# Patient Record
Sex: Female | Born: 1965 | Race: White | Hispanic: No | Marital: Married | Smoking: Current every day smoker
Health system: Southern US, Community
[De-identification: ages and names within clinical notes are randomized; demographics above are authoritative.]

## PROBLEM LIST (undated history)

## (undated) DIAGNOSIS — K219 Gastro-esophageal reflux disease without esophagitis: Secondary | ICD-10-CM

## (undated) DIAGNOSIS — E039 Hypothyroidism, unspecified: Secondary | ICD-10-CM

## (undated) HISTORY — PX: OTHER SURGICAL HISTORY: SHX169

## (undated) HISTORY — PX: TONSILLECTOMY: SUR1361

## (undated) HISTORY — PX: SPLENECTOMY: SUR1306

## (undated) HISTORY — PX: ABDOMINAL HYSTERECTOMY: SHX81

## (undated) HISTORY — DX: Gastro-esophageal reflux disease without esophagitis: K21.9

## (undated) HISTORY — DX: Hypothyroidism, unspecified: E03.9

---

## 2015-07-02 ENCOUNTER — Ambulatory Visit (INDEPENDENT_AMBULATORY_CARE_PROVIDER_SITE_OTHER): Payer: BLUE CROSS/BLUE SHIELD | Admitting: "Endocrinology

## 2015-07-02 ENCOUNTER — Encounter: Payer: Self-pay | Admitting: "Endocrinology

## 2015-07-02 VITALS — BP 106/65 | HR 68 | Ht 64.0 in | Wt 165.0 lb

## 2015-07-02 DIAGNOSIS — R7302 Impaired glucose tolerance (oral): Secondary | ICD-10-CM

## 2015-07-02 DIAGNOSIS — R7309 Other abnormal glucose: Secondary | ICD-10-CM

## 2015-07-02 DIAGNOSIS — E039 Hypothyroidism, unspecified: Secondary | ICD-10-CM | POA: Diagnosis not present

## 2015-07-02 DIAGNOSIS — E559 Vitamin D deficiency, unspecified: Secondary | ICD-10-CM | POA: Diagnosis not present

## 2015-07-02 DIAGNOSIS — R7303 Prediabetes: Secondary | ICD-10-CM | POA: Insufficient documentation

## 2015-07-02 NOTE — Progress Notes (Signed)
Subjective:    Patient ID: Lorraine Simon, female    DOB: 01/03/1966, PCP Lorraine Simon   Past Medical History  Diagnosis Date  . Hypothyroidism   . GERD (gastroesophageal reflux disease)    Past Surgical History  Procedure Laterality Date  . Tonsillectomy    . Abdominal hysterectomy    . Other surgical history      Spleenectomy   Social History   Social History  . Marital Status: Married    Spouse Name: N/A  . Number of Children: N/A  . Years of Education: N/A   Social History Main Topics  . Smoking status: Current Every Day Smoker  . Smokeless tobacco: Not on file  . Alcohol Use: No  . Drug Use: No  . Sexual Activity: Not on file   Other Topics Concern  . Not on file   Social History Narrative  . No narrative on file   Outpatient Encounter Prescriptions as of 07/02/2015  Medication Sig  . fluticasone (FLONASE) 50 MCG/ACT nasal spray Place into both nostrils daily.  Marland Kitchen. gabapentin (NEURONTIN) 300 MG capsule Take 300 mg by mouth 2 (two) times daily.  Marland Kitchen. levothyroxine (SYNTHROID, LEVOTHROID) 75 MCG tablet Take 75 mcg by mouth daily before breakfast.  . loratadine (CLARITIN) 10 MG tablet Take 10 mg by mouth daily.  . metFORMIN (GLUCOPHAGE) 500 MG tablet Take by mouth daily with breakfast.  . sertraline (ZOLOFT) 50 MG tablet Take 50 mg by mouth daily.  . Vitamin D, Ergocalciferol, (DRISDOL) 50000 UNITS CAPS capsule Take 50,000 Units by mouth every 7 (seven) days.   No facility-administered encounter medications on file as of 07/02/2015.   ALLERGIES: Allergies  Allergen Reactions  . Codeine   . Hydrocodone   . Oxycodone    VACCINATION STATUS:  There is no immunization history on file for this patient.  HPI  Lorraine Simon is a 49 yr old female with medical hx of Hashimoto's thyroiditis with hypothyroidism . she is here to f/u with repeat labs. her labs also show prediabetes ans vit D deficiency. she tolerated MTF, a1c stays at 5.8%.  she is tolerating  Lt4 75 mcg po qam . she has no new complaints. she lost a total of 37 lbs.  she feels well. denies any hx of goiter. she has family hx of thyroid dysfunction in her mother, but denies any hx of thyroid cancer. she is a chronic heavy smoker.  Review of Systems  Constitutional: no weight gain/loss, no fatigue, no subjective hyperthermia/hypothermia Eyes: no blurry vision, no xerophthalmia ENT: no sore throat, no nodules palpated in throat, no dysphagia/odynophagia, no hoarseness Cardiovascular: no CP/SOB/palpitations/leg swelling Respiratory: no cough/SOB Gastrointestinal: no N/V/D/C Musculoskeletal: no muscle/joint aches Skin: no rashes Neurological: no tremors/numbness/tingling/dizziness Psychiatric: no depression/anxiety   Objective:    BP 106/65 mmHg  Pulse 68  Ht 5\' 4"  (1.626 m)  Wt 165 lb (74.844 kg)  BMI 28.31 kg/m2  SpO2 95%  Wt Readings from Last 3 Encounters:  07/02/15 165 lb (74.844 kg)    Physical Exam   Constitutional: overweight, in NAD Eyes: PERRLA, EOMI, no exophthalmos ENT: moist mucous membranes, no thyromegaly, no cervical lymphadenopathy Cardiovascular: RRR, No MRG Respiratory: CTA B Gastrointestinal: abdomen soft, NT, ND, BS+ Musculoskeletal: no deformities, strength intact in all 4 Skin: moist, warm, no rashes Neurological: no tremor with outstretched hands, DTR normal in all 4   Assessment & Plan:   1. Primary hypothyroidism she has Hashimotos's thyroidistis with Hypothyroidism. She has done better with  LT4 at 75 mcg po qam.  TFTs are c/w appropriate replacement. she will continue on same.   2. Impaired glucose tolerance test  I have discussed prediabetes in detail with her, dietary, exercise recommendation are provided. I have suggested and she accepted MTF  po qday to delay progress to type 2 DM, her last a1c remains stable at 5.8%.   3. Vitamin D deficiency She  is s/p therapy with Vitamin D x 12 weeks.  she is extensively  counseled on smoking cessation. she will RTN in 6 months  with repeat labs, for reevaluation.   - I advised patient to maintain close follow up with [facility], [facility] for primary care needs.  Follow up plan: Return in about 6 months (around 12/30/2015) for underactive thyroid, prediabetes.  [name], MD Phone: [phone]  Fax: [phone]   07/02/2015, 9:33 PM

## 2015-07-18 ENCOUNTER — Other Ambulatory Visit: Payer: Self-pay

## 2015-07-18 MED ORDER — METFORMIN HCL 500 MG PO TABS: 500.0000 mg | ORAL_TABLET | Freq: Every day | ORAL | Status: DC

## 2015-07-18 MED ORDER — LEVOTHYROXINE SODIUM 75 MCG PO TABS: 75.0000 ug | ORAL_TABLET | Freq: Every day | ORAL | Status: DC

## 2015-12-18 ENCOUNTER — Other Ambulatory Visit: Payer: Self-pay | Admitting: "Endocrinology

## 2015-12-18 DIAGNOSIS — R7309 Other abnormal glucose: Secondary | ICD-10-CM

## 2015-12-18 DIAGNOSIS — E039 Hypothyroidism, unspecified: Secondary | ICD-10-CM

## 2015-12-18 LAB — BASIC METABOLIC PANEL
BUN: 9 mg/dL (ref 7–25)
CALCIUM: 9.4 mg/dL (ref 8.6–10.2)
CO2: 26 mmol/L (ref 20–31)
Chloride: 102 mmol/L (ref 98–110)
Creat: 0.81 mg/dL (ref 0.50–1.10)
GLUCOSE: 89 mg/dL (ref 65–99)
Potassium: 4.2 mmol/L (ref 3.5–5.3)
SODIUM: 137 mmol/L (ref 135–146)

## 2015-12-18 LAB — T4, FREE: Free T4: 1.4 ng/dL (ref 0.8–1.8)

## 2015-12-18 LAB — TSH: TSH: 1.59 m[IU]/L

## 2015-12-19 LAB — HEMOGLOBIN A1C
Hgb A1c MFr Bld: 5.5 % (ref <5.7)
Mean Plasma Glucose: 111 mg/dL

## 2016-01-01 ENCOUNTER — Ambulatory Visit (INDEPENDENT_AMBULATORY_CARE_PROVIDER_SITE_OTHER): Payer: BLUE CROSS/BLUE SHIELD | Admitting: "Endocrinology

## 2016-01-01 ENCOUNTER — Encounter: Payer: Self-pay | Admitting: "Endocrinology

## 2016-01-01 VITALS — BP 110/70 | HR 70 | Ht 64.0 in | Wt 170.0 lb

## 2016-01-01 DIAGNOSIS — E559 Vitamin D deficiency, unspecified: Secondary | ICD-10-CM

## 2016-01-01 DIAGNOSIS — E039 Hypothyroidism, unspecified: Secondary | ICD-10-CM

## 2016-01-01 DIAGNOSIS — R7302 Impaired glucose tolerance (oral): Secondary | ICD-10-CM | POA: Diagnosis not present

## 2016-01-01 DIAGNOSIS — R7309 Other abnormal glucose: Secondary | ICD-10-CM

## 2016-01-01 MED ORDER — LEVOTHYROXINE SODIUM 75 MCG PO TABS: 75.0000 ug | ORAL_TABLET | Freq: Every day | ORAL | Status: DC

## 2016-01-01 MED ORDER — METFORMIN HCL 500 MG PO TABS: 500.0000 mg | ORAL_TABLET | Freq: Every day | ORAL | Status: DC

## 2016-01-01 MED ORDER — VITAMIN D (ERGOCALCIFEROL) 1.25 MG (50000 UNIT) PO CAPS: 50000.0000 [IU] | ORAL_CAPSULE | ORAL | Status: DC

## 2016-01-01 NOTE — Progress Notes (Signed)
Subjective:    Patient ID: Lorraine Simon, female    DOB: 01-Jan-1966, PCP Lorelei Pont, DO   Past Medical History  Diagnosis Date  . Hypothyroidism   . GERD (gastroesophageal reflux disease)    Past Surgical History  Procedure Laterality Date  . Tonsillectomy    . Abdominal hysterectomy    . Other surgical history      Spleenectomy   Social History   Social History  . Marital Status: Married    Spouse Name: N/A  . Number of Children: N/A  . Years of Education: N/A   Social History Main Topics  . Smoking status: Current Every Day Smoker  . Smokeless tobacco: None  . Alcohol Use: No  . Drug Use: No  . Sexual Activity: Not Asked   Other Topics Concern  . None   Social History Narrative   Outpatient Encounter Prescriptions as of 01/01/2016  Medication Sig  . linaclotide (LINZESS) 145 MCG CAPS capsule Take 145 mcg by mouth daily before breakfast.  . omeprazole (PRILOSEC) 20 MG capsule Take 20 mg by mouth daily.  Marland Kitchen zolpidem (AMBIEN) 10 MG tablet Take 10 mg by mouth at bedtime as needed for sleep.  . fluticasone (FLONASE) 50 MCG/ACT nasal spray Place into both nostrils daily.  Marland Kitchen levothyroxine (SYNTHROID, LEVOTHROID) 75 MCG tablet Take 1 tablet (75 mcg total) by mouth daily before breakfast.  . loratadine (CLARITIN) 10 MG tablet Take 10 mg by mouth daily.  . metFORMIN (GLUCOPHAGE) 500 MG tablet Take 1 tablet (500 mg total) by mouth daily with breakfast.  . sertraline (ZOLOFT) 50 MG tablet Take 50 mg by mouth daily.  . Vitamin D, Ergocalciferol, (DRISDOL) 50000 units CAPS capsule Take 1 capsule (50,000 Units total) by mouth every 28 (twenty-eight) days.  . [DISCONTINUED] gabapentin (NEURONTIN) 300 MG capsule Take 300 mg by mouth 2 (two) times daily.  . [DISCONTINUED] levothyroxine (SYNTHROID, LEVOTHROID) 75 MCG tablet Take 1 tablet (75 mcg total) by mouth daily before breakfast.  . [DISCONTINUED] metFORMIN (GLUCOPHAGE) 500 MG tablet Take 1 tablet (500 mg total) by mouth  daily with breakfast.  . [DISCONTINUED] Vitamin D, Ergocalciferol, (DRISDOL) 50000 UNITS CAPS capsule Take 50,000 Units by mouth every 28 (twenty-eight) days.   No facility-administered encounter medications on file as of 01/01/2016.   ALLERGIES: Allergies  Allergen Reactions  . Codeine   . Hydrocodone   . Oxycodone    VACCINATION STATUS:  There is no immunization history on file for this patient.  HPI  Lorraine Simon is a 50 yr old female with medical hx of Hashimoto's thyroiditis with hypothyroidism . she is here to f/u with repeat labs. -She also has prediabetes and vitamin D deficiency.  she tolerated MTF, a1c Improved to 5.5%.  she is tolerating Lt4 75 mcg po qam . she has no new complaints.  she has gained 5 pounds after she lost a total of 37 lbs. she explains this is due to her recent initiation of Zoloft.   she feels well. denies any hx of goiter. she has family hx of thyroid dysfunction in her mother, but denies any hx of thyroid cancer. she is a chronic heavy smoker.  Review of Systems  Constitutional: no weight gain/loss, no fatigue, no subjective hyperthermia/hypothermia Eyes: no blurry vision, no xerophthalmia ENT: no sore throat, no nodules palpated in throat, no dysphagia/odynophagia, no hoarseness Cardiovascular: no CP/SOB/palpitations/leg swelling Respiratory: no cough/SOB Gastrointestinal: no N/V/D/C Musculoskeletal: no muscle/joint aches Skin: no rashes Neurological: no tremors/numbness/tingling/dizziness Psychiatric: no depression/anxiety  Objective:    BP 110/70 mmHg  Pulse 70  Ht 5\' 4"  (1.626 m)  Wt 170 lb (77.111 kg)  BMI 29.17 kg/m2  SpO2 97%  Wt Readings from Last 3 Encounters:  01/01/16 170 lb (77.111 kg)  07/02/15 165 lb (74.844 kg)    Physical Exam   Constitutional: overweight, in NAD Eyes: PERRLA, EOMI, no exophthalmos ENT: moist mucous membranes, no thyromegaly, no cervical lymphadenopathy Cardiovascular: RRR, No MRG Respiratory:  CTA B Gastrointestinal: abdomen soft, NT, ND, BS+ Musculoskeletal: no deformities, strength intact in all 4 Skin: moist, warm, no rashes Neurological: no tremor with outstretched hands, DTR normal in all 4   Assessment & Plan:   1. Primary hypothyroidism she has Hashimotos's thyroidistis with Hypothyroidism. She has done better with LT4 at 75 mcg po qam.  TFTs are c/w appropriate replacement.  - We discussed about correct intake of levothyroxine, at fasting, with water, separated by at least 30 minutes from breakfast, and separated by more than 4 hours from calcium, iron, multivitamins, acid reflux medications (PPIs). -Patient is made aware of the fact that thyroid hormone replacement is needed for life, dose to be adjusted by periodic monitoring of thyroid function tests.    2. Impaired glucose tolerance test  I have discussed prediabetes in detail with her, dietary, exercise recommendation are provided. I have  advised her to continue  MTF 500mg  po qday to delay progress to type 2 DM, her last a1c   has improved to 5.5% from t 5.8%.   3. Vitamin D deficiency -I will lower vitamin D  50,000 units every 28 days.   4. Chronic smoker: she is extensively counseled on smoking cessation x 15 minutes. she will RTN in 6 months  with repeat labs, for reevaluation.   - [facility]  Follow up plan: Return in about 6 months (around 07/02/2016) for prediabetes, underactive thyroid, Vitamin D deficiency, follow up with pre-visit labs.  Marquis LunchGebre Derryl Uher, MD Phone: [phone]  Fax: [phone]   01/01/2016, 10:36 AM

## 2016-06-20 ENCOUNTER — Other Ambulatory Visit: Payer: Self-pay | Admitting: "Endocrinology

## 2016-06-21 LAB — T4, FREE: FREE T4: 1.6 ng/dL (ref 0.8–1.8)

## 2016-06-21 LAB — TSH: TSH: 0.84 m[IU]/L

## 2016-06-22 LAB — VITAMIN D 25 HYDROXY (VIT D DEFICIENCY, FRACTURES): VIT D 25 HYDROXY: 37 ng/mL (ref 30–100)

## 2016-07-02 ENCOUNTER — Ambulatory Visit (INDEPENDENT_AMBULATORY_CARE_PROVIDER_SITE_OTHER): Payer: BLUE CROSS/BLUE SHIELD | Admitting: "Endocrinology

## 2016-07-02 ENCOUNTER — Encounter: Payer: Self-pay | Admitting: "Endocrinology

## 2016-07-02 VITALS — BP 103/65 | HR 65 | Ht 64.0 in | Wt 164.0 lb

## 2016-07-02 DIAGNOSIS — R7303 Prediabetes: Secondary | ICD-10-CM

## 2016-07-02 DIAGNOSIS — E559 Vitamin D deficiency, unspecified: Secondary | ICD-10-CM

## 2016-07-02 DIAGNOSIS — E039 Hypothyroidism, unspecified: Secondary | ICD-10-CM

## 2016-07-02 MED ORDER — METFORMIN HCL 500 MG PO TABS: 500.0000 mg | ORAL_TABLET | Freq: Every day | ORAL | 6 refills | Status: AC

## 2016-07-02 MED ORDER — LEVOTHYROXINE SODIUM 75 MCG PO TABS: 75.0000 ug | ORAL_TABLET | Freq: Every day | ORAL | 6 refills | Status: DC

## 2016-07-02 MED ORDER — VITAMIN D3 125 MCG (5000 UT) PO CAPS: 5000.0000 [IU] | ORAL_CAPSULE | Freq: Every day | ORAL | 0 refills | Status: AC

## 2016-07-02 NOTE — Progress Notes (Signed)
Subjective:    Patient ID: [name], female    DOB: 01/05/1966, PCP Lorelei PontMAHONEY,MARK, DO   Past Medical History:  Diagnosis Date  . GERD (gastroesophageal reflux disease)   . Hypothyroidism    Past Surgical History:  Procedure Laterality Date  . ABDOMINAL HYSTERECTOMY    . OTHER SURGICAL HISTORY     Spleenectomy  . TONSILLECTOMY     Social History   Social History  . Marital status: Married    Spouse name: N/A  . Number of children: N/A  . Years of education: N/A   Social History Main Topics  . Smoking status: Current Every Day Smoker  . Smokeless tobacco: Not on file  . Alcohol use No  . Drug use: No  . Sexual activity: Not on file   Other Topics Concern  . Not on file   Social History Narrative  . No narrative on file   Outpatient Encounter Prescriptions as of 07/02/2016  Medication Sig  . divalproex (DEPAKOTE) 250 MG DR tablet Take 250 mg by mouth 2 (two) times daily.  . Cholecalciferol (VITAMIN D3) 5000 units CAPS Take 1 capsule (5,000 Units total) by mouth daily.  . fluticasone (FLONASE) 50 MCG/ACT nasal spray Place into both nostrils daily.  Marland Kitchen. levothyroxine (SYNTHROID, LEVOTHROID) 75 MCG tablet Take 1 tablet (75 mcg total) by mouth daily before breakfast.  . linaclotide (LINZESS) 145 MCG CAPS capsule Take 145 mcg by mouth daily before breakfast.  . loratadine (CLARITIN) 10 MG tablet Take 10 mg by mouth daily.  . metFORMIN (GLUCOPHAGE) 500 MG tablet Take 1 tablet (500 mg total) by mouth daily with breakfast.  . omeprazole (PRILOSEC) 20 MG capsule Take 20 mg by mouth daily.  . sertraline (ZOLOFT) 50 MG tablet Take 50 mg by mouth daily.  Marland Kitchen. zolpidem (AMBIEN) 10 MG tablet Take 10 mg by mouth at bedtime as needed for sleep.  . [DISCONTINUED] levothyroxine (SYNTHROID, LEVOTHROID) 75 MCG tablet Take 1 tablet (75 mcg total) by mouth daily before breakfast.  . [DISCONTINUED] metFORMIN (GLUCOPHAGE) 500 MG tablet Take 1 tablet (500 mg total) by mouth daily with  breakfast.  . [DISCONTINUED] Vitamin D, Ergocalciferol, (DRISDOL) 50000 units CAPS capsule Take 1 capsule (50,000 Units total) by mouth every 28 (twenty-eight) days.   No facility-administered encounter medications on file as of 07/02/2016.    ALLERGIES: Allergies  Allergen Reactions  . Codeine   . Hydrocodone   . Oxycodone    VACCINATION STATUS:  There is no immunization history on file for this patient.  HPI  Lorraine Simon is a 50 yr old female with medical hx of Hashimoto's thyroiditis with hypothyroidism . she is here to f/u with repeat labs. -She also has prediabetes and vitamin D deficiency.  she tolerated MTF, a1c Improved to 5.5%.  she is tolerating Lt4 75 mcg po qam . she has no new complaints.  she has lost 6 pounds since last visit.   she feels well. denies any hx of goiter. she has family hx of thyroid dysfunction in her mother, but denies any hx of thyroid cancer. she is a chronic heavy smoker.  Review of Systems  Constitutional: + weight loss, no fatigue, no subjective hyperthermia/hypothermia Eyes: no blurry vision, no xerophthalmia ENT: no sore throat, no nodules palpated in throat, no dysphagia/odynophagia, no hoarseness Cardiovascular: no CP/SOB/palpitations/leg swelling Respiratory: no cough/SOB Gastrointestinal: no N/V/D/C Musculoskeletal: no muscle/joint aches Skin: no rashes Neurological: no tremors/numbness/tingling/dizziness Psychiatric: no depression/anxiety   Objective:    BP 103/65  Pulse 65   Ht 5\' 4"  (1.626 m)   Wt 164 lb (74.4 kg)   BMI 28.15 kg/m   Wt Readings from Last 3 Encounters:  07/02/16 164 lb (74.4 kg)  01/01/16 170 lb (77.1 kg)  07/02/15 165 lb (74.8 kg)    Physical Exam   Constitutional: overweight, in NAD Eyes: PERRLA, EOMI, no exophthalmos ENT: moist mucous membranes, no thyromegaly, no cervical lymphadenopathy Cardiovascular: RRR, No MRG Respiratory: CTA B Gastrointestinal: abdomen soft, NT, ND,  BS+ Musculoskeletal: no deformities, strength intact in all 4 Skin: moist, warm, no rashes Neurological: no tremor with outstretched hands, DTR normal in all 4   Assessment & Plan:   1. Primary hypothyroidism she has Hashimotos's thyroidistis with Hypothyroidism. She has done better with LT4 at 75 mcg po qam.  TFTs are c/w appropriate replacement.  - We discussed about correct intake of levothyroxine, at fasting, with water, separated by at least 30 minutes from breakfast, and separated by more than 4 hours from calcium, iron, multivitamins, acid reflux medications (PPIs). -Patient is made aware of the fact that thyroid hormone replacement is needed for life, dose to be adjusted by periodic monitoring of thyroid function tests.    2.  Prediabetes:  I have discussed prediabetes in detail with her, dietary, exercise recommendation are provided. I have  advised her to continue  MTF 500mg  po qday to delay progress to type 2 DM, her last a1c   has improved to 5.5% from t 5.8%.   3. Vitamin D deficiency -I will lower vitamin D  5,000 units daily for the next 90 days.  4. Chronic smoker: she is extensively counseled on smoking cessation x 15 minutes. she will RTN in 6 months  with repeat labs, for reevaluation.   - I [facility]  Follow up plan: Return in about 6 months (around 12/30/2016) for follow up with pre-visit labs.  Marquis LunchGebre Bethanee Redondo, MD Phone: [phone]  Fax: [phone]   07/02/2016, 11:05 AM

## 2016-12-21 ENCOUNTER — Other Ambulatory Visit: Payer: Self-pay

## 2016-12-21 MED ORDER — LEVOTHYROXINE SODIUM 75 MCG PO TABS: 75.0000 ug | ORAL_TABLET | Freq: Every day | ORAL | 6 refills | Status: AC

## 2016-12-31 ENCOUNTER — Ambulatory Visit: Payer: BLUE CROSS/BLUE SHIELD | Admitting: "Endocrinology

## 2017-11-02 ENCOUNTER — Encounter: Payer: Self-pay | Admitting: Rheumatology

## 2017-12-30 NOTE — Progress Notes (Signed)
Office Visit Note  Patient: Lorraine Simon             Date of Birth: 1966-03-16           MRN: [id number]             PCP: Emelda Fear, DO Referring: Emelda Fear, DO Visit Date: 01/07/2018 Occupation: Pharmacy technician    Subjective:  Positive ANA  History of Present Illness: Lorraine Simon is a 52 y.o. female consultation per request of her PCP.  According to patient her symptoms are started about 23 years ago with increased fatigue, neck and lower back pain, insomnia and anxiety.  She states she was experiencing generalized pain.  She has seen several physicians during that time.  She states that she got older she started experiencing increased pain in her C-spine, elbows, wrist and her knee joints.  She states she was diagnosed with depression and was placed on Lexapro which she took for about 8 years and gained 60 pounds.  She also became prediabetic.  She decided to get off Lexapro which helped her with the weight loss and her hemoglobin A1c normalized.  She states she had some difficult.  When she lost her job and had nervous breakdown.  She was seen by a psychiatrist and was placed on Effexor, mirtazapine, Lamictal.  The mirtazapine was was tapered off.  She discontinued Effexor due to the cost.  And she continued on Lamictal.  She was recently prescribed Ativan for anxiety.  Which she takes on PRN basis.  She has been experiencing ringing in her years and also frequent falls.  She states with recent visit with her doctor she had some lab work done which was positive for ANA for that reason she was referred to me.  She has noticed some swelling in her knee joints.  There is no swelling in any other joints.  She has seen ENT physician who diagnosed her with mild hearing loss.  Activities of Daily Living:  Patient reports morning stiffness for 1 hour.   Patient Denies nocturnal pain.  Difficulty dressing/grooming: Denies Difficulty climbing stairs: Reports Difficulty getting out of  chair: Denies Difficulty using hands for taps, buttons, cutlery, and/or writing: Denies   Review of Systems  Constitutional: Positive for fatigue. Negative for night sweats, weight gain and weight loss.  HENT: Positive for mouth dryness. Negative for mouth sores, trouble swallowing, trouble swallowing and nose dryness.   Eyes: Positive for dryness. Negative for pain, redness and visual disturbance.  Respiratory: Negative for cough, hemoptysis, shortness of breath and difficulty breathing.   Cardiovascular: Negative for chest pain, palpitations, hypertension, irregular heartbeat and swelling in legs/feet.  Gastrointestinal: Positive for constipation. Negative for blood in stool and diarrhea.  Endocrine: Negative for increased urination.  Genitourinary: Negative for painful urination and vaginal dryness.  Musculoskeletal: Positive for arthralgias, joint pain, joint swelling, myalgias, morning stiffness and myalgias. Negative for muscle weakness and muscle tenderness.  Skin: Positive for hair loss. Negative for color change, pallor, rash, nodules/bumps, skin tightness, ulcers and sensitivity to sunlight.  Allergic/Immunologic: Negative for susceptible to infections.  Neurological: Positive for headaches. Negative for dizziness, numbness, memory loss, night sweats and weakness.  Hematological: Positive for swollen glands.  Psychiatric/Behavioral: Negative for depressed mood and sleep disturbance. The patient is nervous/anxious.     PMFS History:  Patient Active Problem List   Diagnosis Date Noted  . History of IBS 01/07/2018  . Xanthelasma 01/07/2018  . Onychomycosis due to dermatophyte 01/07/2018  .  Gastroesophageal reflux disease without esophagitis 01/07/2018  . History of insomnia 01/07/2018  . History of anxiety 01/07/2018  . History of hypothyroidism 01/07/2018  . Family history of lupus erythematosus 01/07/2018  . Smoker 01/07/2018  . Primary hypothyroidism 07/02/2015  .  Prediabetes 07/02/2015  . Vitamin D deficiency 07/02/2015    Past Medical History:  Diagnosis Date  . GERD (gastroesophageal reflux disease)   . Hypothyroidism     Family History  Problem Relation Age of Onset  . Hypertension Mother   . Thyroid disease Mother   . Diabetes Mother   . Hypertension Father   . High Cholesterol Father   . Heart Problems Father   . Cancer Father        lung   . Heart attack Father   . Lupus Sister   . Lupus Maternal Aunt   . Rheum arthritis Maternal Aunt   . Rheum arthritis Paternal Grandmother   . Healthy Daughter   . Healthy Daughter    Past Surgical History:  Procedure Laterality Date  . ABDOMINAL HYSTERECTOMY    . OTHER SURGICAL HISTORY     Spleenectomy  . SPLENECTOMY    . TONSILLECTOMY     Social History   Social History Narrative  . Not on file     Objective: Vital Signs: BP 123/66 (BP Location: Left Arm, Patient Position: Sitting, Cuff Size: Normal)   Pulse (!) 59   Resp 14   Ht 5' 4.5" (1.638 m)   Wt 154 lb (69.9 kg)   BMI 26.03 kg/m    Physical Exam  Constitutional: She is oriented to person, place, and time. She appears well-developed and well-nourished.  HENT:  Head: Normocephalic and atraumatic.  Eyes: Conjunctivae and EOM are normal.  Neck: Normal range of motion.  Cardiovascular: Normal rate, regular rhythm, normal heart sounds and intact distal pulses.  Pulmonary/Chest: Effort normal and breath sounds normal.  Abdominal: Soft. Bowel sounds are normal.  Lymphadenopathy:    She has cervical adenopathy.  Neurological: She is alert and oriented to person, place, and time.  Skin: Skin is warm and dry. Capillary refill takes less than 2 seconds.  Psychiatric: She has a normal mood and affect. Her behavior is normal.  Nursing note and vitals reviewed.    Musculoskeletal Exam: Vertical thoracic and lumbar spine good range of motion.  Shoulder joints elbow joints wrist joint MCPs PIPs DIPs were in good range of  motion with no synovitis.  Hip joints knee joints ankles MTPs PIPs DIPs were in good range of motion with no synovitis.  She had discomfort across her PIP joints in her hands.  She also has crepitus in her bilateral knee joints without any warmth or swelling.  She has some arthritic changes in her PIP joints of her feet.   CDAI Exam: No CDAI exam completed.    Investigation: Findings:  11/02/17: ANA +, dsDNA negative, RNP 2.8, smith -, Scl-70 negative, Ro -, La-, Antichomatin -, Anti Jo-1 negative, anti-centromere -, TSH 3.050, plts 423   CMP Latest Ref Rng & Units 12/18/2015  Glucose 65 - 99 mg/dL 89  BUN 7 - 25 mg/dL 9  Creatinine 0.50 - 1.10 mg/dL 0.81  Sodium 135 - 146 mmol/L 137  Potassium 3.5 - 5.3 mmol/L 4.2  Chloride 98 - 110 mmol/L 102  CO2 20 - 31 mmol/L 26  Calcium 8.6 - 10.2 mg/dL 9.4      Imaging: Xr Hand 2 View Left  Result Date: 01/07/2018 Minimal PIP  narrowing was noted.  No MCP intercarpal radiocarpal joint space narrowing was noted.  No erosive changes were noted. Impression: Unremarkable x-ray of the hand.  Xr Hand 2 View Right  Result Date: 01/07/2018 Minimal PIP narrowing was noted.  No MCP intercarpal radiocarpal joint space narrowing was noted.  No erosive changes were noted. Impression: Unremarkable x-ray of the hand.  Xr Knee 3 View Left  Result Date: 01/07/2018 No joint space narrowing was noted.  No chondrocalcinosis was noted.  No osteophytes were noted.  Mild patellofemoral narrowing was noted. 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  Xr Knee 3 View Right  Result Date: 01/07/2018 No joint space narrowing was noted.  No chondrocalcinosis was noted.  No osteophytes were noted.  Mild patellofemoral narrowing was noted. 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   Speciality Comments: No specialty comments available.    Procedures:  No procedures performed Allergies: Codeine;  Hydrocodone; and Oxycodone   Assessment / Plan:     Visit Diagnoses: Positive ANA (antinuclear antibody) - 11/02/17: ANA +, dsDNA negative, RNP 2.8, smith -, Scl-70 negative, Ro -, La-, Antichomatin -, Anti Jo-1 negative, anti-centromere -, TSH 3.050, plts 423.  No ANA titer is available.  She has positive ANA and positive RNP but no clinical features of mixed connective tissue disease or lupus on clinical examination.  She has cervical lymphadenopathy and fatigue.  I am uncertain about the etiology at this point.  It is not unusual to see positive ANA and the family members of lupus.  I would like to observe at this time.  I will repeat her labs [facility] labs in about 4 months.  If she develops any new symptoms she is supposed to notify me.  Pain in both hands -she had no synovitis on examination.-No nailbed capillary changes were noted.  No evidence of rainouts phenomenon was noted.  I will obtain x-ray of bilateral hands 2 views today which were unremarkable.  Chronic pain in both knees-she has been having discomfort in her both knees.  No warmth swelling or effusion was noted.  X-ray of bilateral knee joints 3 views were obtained today which showed mild chondromalacia patella.  Handout on knee joint exercises was given.  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  History of ITP   Cervical lymphadenopathy-patient gives history of chronic cervical lymphadenopathy for many years.  I have advised her to follow-up with ENT.  Fatigue-we will obtain CK, SPEP, immunoglobulins and ESR.  Family history of lupus erythematosus - Sister, maternal aunt  Vitamin D deficiency-she was treated with vitamin D supplement in the past.  She is not taking vitamin D now.  I have advised her to continue vitamin D.  History of hypothyroidism  History of anxiety  History of insomnia - Ambien 10 mg  Gastroesophageal reflux disease without esophagitis  Onychomycosis due to  dermatophyte  Xanthelasma  History of IBS    Orders: Orders Placed This Encounter  Procedures  . XR KNEE 3 VIEW RIGHT  . XR KNEE 3 VIEW LEFT  . XR Hand 2 View Right  . XR Hand 2 View Left  . DG Chest 2 View  . CK  . Serum protein electrophoresis with reflex  . IgG, IgA, IgM  . VITAMIN D 25 Hydroxy (Vit-D Deficiency, Fractures)  . Sedimentation rate   No orders of the defined types were placed in this encounter.   Face-to-face time spent with patient was 50 minutes. >50% of  time was spent in counseling and coordination of care.  Follow-Up Instructions: Return in about 4 months (around 05/09/2018) for Positive ANA, positive RNP, osteoarthritis.   Bo Merino, MD  Note - This record has been created using Editor, commissioning.  Chart creation errors have been sought, but may not always  have been located. Such creation errors do not reflect on  the standard of medical care.

## 2018-01-07 ENCOUNTER — Ambulatory Visit (INDEPENDENT_AMBULATORY_CARE_PROVIDER_SITE_OTHER): Payer: Self-pay

## 2018-01-07 ENCOUNTER — Ambulatory Visit (HOSPITAL_COMMUNITY)
Admission: RE | Admit: 2018-01-07 | Discharge: 2018-01-07 | Disposition: A | Discharge status: Home / Self Care | Payer: 59 | Source: Ambulatory Visit | Attending: Rheumatology | Admitting: Rheumatology

## 2018-01-07 ENCOUNTER — Ambulatory Visit: Payer: 59 | Admitting: Rheumatology

## 2018-01-07 ENCOUNTER — Encounter: Payer: Self-pay | Admitting: Rheumatology

## 2018-01-07 VITALS — BP 123/66 | HR 59 | Resp 14 | Ht 64.5 in | Wt 154.0 lb

## 2018-01-07 DIAGNOSIS — G8929 Other chronic pain: Secondary | ICD-10-CM

## 2018-01-07 DIAGNOSIS — R5383 Other fatigue: Secondary | ICD-10-CM | POA: Diagnosis not present

## 2018-01-07 DIAGNOSIS — M25561 Pain in right knee: Secondary | ICD-10-CM

## 2018-01-07 DIAGNOSIS — F17200 Nicotine dependence, unspecified, uncomplicated: Secondary | ICD-10-CM

## 2018-01-07 DIAGNOSIS — Z8639 Personal history of other endocrine, nutritional and metabolic disease: Secondary | ICD-10-CM | POA: Insufficient documentation

## 2018-01-07 DIAGNOSIS — E559 Vitamin D deficiency, unspecified: Secondary | ICD-10-CM

## 2018-01-07 DIAGNOSIS — R590 Localized enlarged lymph nodes: Secondary | ICD-10-CM | POA: Insufficient documentation

## 2018-01-07 DIAGNOSIS — Z840 Family history of diseases of the skin and subcutaneous tissue: Secondary | ICD-10-CM | POA: Diagnosis not present

## 2018-01-07 DIAGNOSIS — Z8659 Personal history of other mental and behavioral disorders: Secondary | ICD-10-CM

## 2018-01-07 DIAGNOSIS — H0260 Xanthelasma of unspecified eye, unspecified eyelid: Secondary | ICD-10-CM

## 2018-01-07 DIAGNOSIS — B351 Tinea unguium: Secondary | ICD-10-CM

## 2018-01-07 DIAGNOSIS — R768 Other specified abnormal immunological findings in serum: Secondary | ICD-10-CM

## 2018-01-07 DIAGNOSIS — M25562 Pain in left knee: Secondary | ICD-10-CM | POA: Diagnosis not present

## 2018-01-07 DIAGNOSIS — F17210 Nicotine dependence, cigarettes, uncomplicated: Secondary | ICD-10-CM | POA: Insufficient documentation

## 2018-01-07 DIAGNOSIS — R7689 Other specified abnormal immunological findings in serum: Secondary | ICD-10-CM

## 2018-01-07 DIAGNOSIS — M79641 Pain in right hand: Secondary | ICD-10-CM

## 2018-01-07 DIAGNOSIS — Z9081 Acquired absence of spleen: Secondary | ICD-10-CM | POA: Diagnosis not present

## 2018-01-07 DIAGNOSIS — K219 Gastro-esophageal reflux disease without esophagitis: Secondary | ICD-10-CM | POA: Diagnosis not present

## 2018-01-07 DIAGNOSIS — Z8719 Personal history of other diseases of the digestive system: Secondary | ICD-10-CM

## 2018-01-07 DIAGNOSIS — M79642 Pain in left hand: Secondary | ICD-10-CM

## 2018-01-07 DIAGNOSIS — Z862 Personal history of diseases of the blood and blood-forming organs and certain disorders involving the immune mechanism: Secondary | ICD-10-CM | POA: Diagnosis not present

## 2018-01-07 DIAGNOSIS — Z87898 Personal history of other specified conditions: Secondary | ICD-10-CM | POA: Diagnosis not present

## 2018-01-07 NOTE — Progress Notes (Signed)
CXR c/w COPD

## 2018-01-07 NOTE — Patient Instructions (Signed)

## 2018-01-10 ENCOUNTER — Encounter: Payer: Self-pay | Admitting: Rheumatology

## 2018-01-10 ENCOUNTER — Telehealth: Payer: Self-pay | Admitting: Rheumatology

## 2018-01-10 NOTE — Telephone Encounter (Signed)
Patient called stating she was returning your call.   °

## 2018-01-10 NOTE — Telephone Encounter (Signed)
Left message to advise patient of results of chest x-ray. Chest x-ray is c/w COPD.

## 2018-01-11 LAB — IGG, IGA, IGM
IgG (Immunoglobin G), Serum: 1288 mg/dL (ref 694–1618)
IgM, Serum: 28 mg/dL — ABNORMAL LOW (ref 48–271)
Immunoglobulin A: 149 mg/dL (ref 81–463)

## 2018-01-11 LAB — PROTEIN ELECTROPHORESIS, SERUM, WITH REFLEX
ALBUMIN ELP: 4 g/dL (ref 3.8–4.8)
Alpha 1: 0.3 g/dL (ref 0.2–0.3)
Alpha 2: 0.7 g/dL (ref 0.5–0.9)
BETA 2: 0.4 g/dL (ref 0.2–0.5)
Beta Globulin: 0.5 g/dL (ref 0.4–0.6)
Gamma Globulin: 1.1 g/dL (ref 0.8–1.7)
TOTAL PROTEIN: 7.1 g/dL (ref 6.1–8.1)

## 2018-01-11 LAB — VITAMIN D 25 HYDROXY (VIT D DEFICIENCY, FRACTURES): VIT D 25 HYDROXY: 29 ng/mL — AB (ref 30–100)

## 2018-01-11 LAB — SEDIMENTATION RATE: Sed Rate: 6 mm/h (ref 0–30)

## 2018-01-11 LAB — CK: Total CK: 131 U/L (ref 29–143)

## 2018-01-11 LAB — IFE INTERPRETATION: IMMUNOFIX ELECTR INT: NOT DETECTED

## 2018-01-11 NOTE — Progress Notes (Signed)
Vitamin D 50,000 units once a week for 3 months. Repeat vitamin D level in 3 months

## 2018-02-16 ENCOUNTER — Ambulatory Visit: Payer: BLUE CROSS/BLUE SHIELD | Admitting: Rheumatology

## 2018-04-27 NOTE — Progress Notes (Deleted)
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   History of Present Illness: Lorraine Simon is a 52 y.o. female ***   Activities of Daily Living:  Patient reports morning stiffness for *** {minute/hour:19697}.   Patient {ACTIONS;DENIES/REPORTS:21021675::"Denies"} nocturnal pain.  Difficulty dressing/grooming: {ACTIONS;DENIES/REPORTS:21021675::"Denies"} Difficulty climbing stairs: {ACTIONS;DENIES/REPORTS:21021675::"Denies"} Difficulty getting out of chair: {ACTIONS;DENIES/REPORTS:21021675::"Denies"} Difficulty using hands for taps, buttons, cutlery, and/or writing: {ACTIONS;DENIES/REPORTS:21021675::"Denies"}  No Rheumatology ROS completed.   PMFS History:  Patient Active Problem List   Diagnosis Date Noted  . History of IBS 01/07/2018  . Xanthelasma 01/07/2018  . Onychomycosis due to dermatophyte 01/07/2018  . Gastroesophageal reflux disease without esophagitis 01/07/2018  . History of insomnia 01/07/2018  . History of anxiety 01/07/2018  . History of hypothyroidism 01/07/2018  . Family history of lupus erythematosus 01/07/2018  . Smoker 01/07/2018  . Primary hypothyroidism 07/02/2015  . Prediabetes 07/02/2015  . Vitamin D deficiency 07/02/2015    Past Medical History:  Diagnosis Date  . GERD (gastroesophageal reflux disease)   . Hypothyroidism     Family History  Problem Relation Age of Onset  . Hypertension Mother   . Thyroid disease Mother   . Diabetes Mother   . Hypertension Father   . High Cholesterol Father   . Heart Problems Father   . Cancer Father        lung   . Heart attack Father   . Lupus Sister   . Lupus Maternal Aunt   . Rheum arthritis Maternal Aunt   . Rheum arthritis Paternal Grandmother   . Healthy Daughter   . Healthy Daughter      Past Surgical History:  Procedure Laterality Date  . ABDOMINAL HYSTERECTOMY    . OTHER SURGICAL HISTORY     Spleenectomy  . SPLENECTOMY    . TONSILLECTOMY     Social History   Social History Narrative  . Not on file    Objective: Vital Signs: There were no vitals taken for this visit.   Physical Exam   Musculoskeletal Exam: ***  CDAI Exam: CDAI Score: Not documented Patient Global Assessment: Not documented; Provider Global Assessment: Not documented Swollen: Not documented; Tender: Not documented Joint Exam   Not documented   There is currently no information documented on the homunculus. Go to the Rheumatology activity and complete the homunculus joint exam.  Investigation: No additional findings.  Imaging: No results found.  Recent Labs: Lab Results  Component Value Date   NA 137 12/18/2015   K 4.2 12/18/2015   CL 102 12/18/2015   CO2 26 12/18/2015   GLUCOSE 89 12/18/2015   BUN 9 12/18/2015   CREATININE 0.81 12/18/2015   PROT 7.1 01/07/2018   CALCIUM 9.4 12/18/2015  January 07, 2018 IFE negative, immunoglobulins IgM low, CK normal, ESR 6, vitamin D 29  Speciality Comments: No specialty comments available.  Procedures:  No procedures performed Allergies: Codeine; Hydrocodone; and Oxycodone   Assessment / Plan:     Visit Diagnoses: Positive ANA (antinuclear antibody)  Chondromalacia of patella, unspecified laterality - Bilateral mild  Family history of lupus erythematosus - Sister and maternal aunt  Vitamin D deficiency   History of  hypothyroidism  History of anxiety  History of insomnia - Ambien 10 mg  Gastroesophageal reflux disease without esophagitis  Onychomycosis due to dermatophyte  Xanthelasma  History of IBS   Orders: No orders of the defined types were placed in this encounter.  No orders of the defined types were placed in this encounter.   Face-to-face time spent with patient was *** minutes. Greater than 50% of  time was spent in counseling and coordination of care.  Follow-Up Instructions: No follow-ups on file.   Bo Merino, MD  Note - This record has been created using Editor, commissioning.  Chart creation errors have been sought, but may not always  have been located. Such creation errors do not reflect on  the standard of medical care.

## 2018-05-04 ENCOUNTER — Encounter: Payer: Self-pay | Admitting: Rheumatology

## 2018-05-11 ENCOUNTER — Ambulatory Visit: Payer: 59 | Admitting: Rheumatology

## 2019-05-19 IMAGING — DX DG CHEST 2V
2 series · 2 of 2 positions shown · non-contrast
Comparison: None.

CLINICAL DATA: Left greater than right cervical lymphadenopathy in
a cigarette smoker.

EXAM:
CHEST - 2 VIEW

[chest pa]
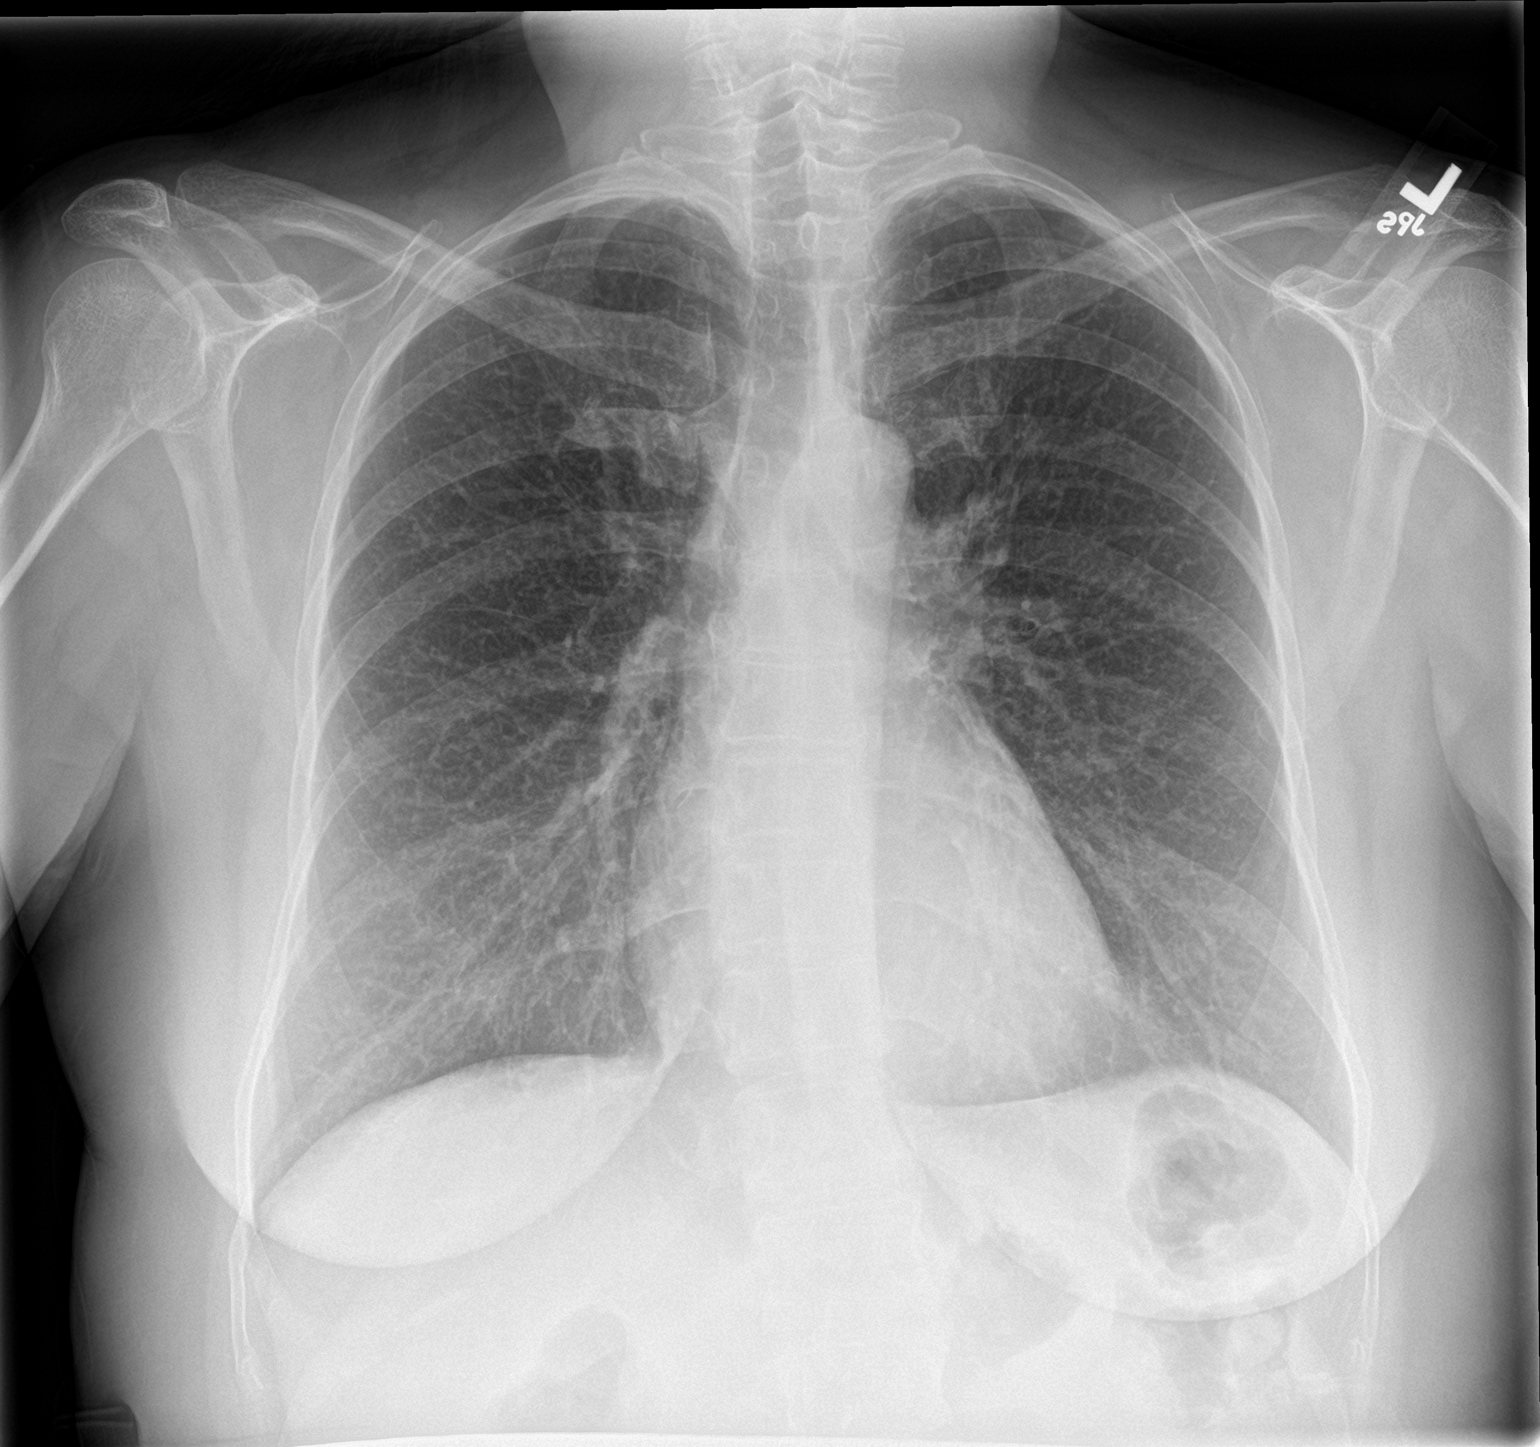

[chest lat]
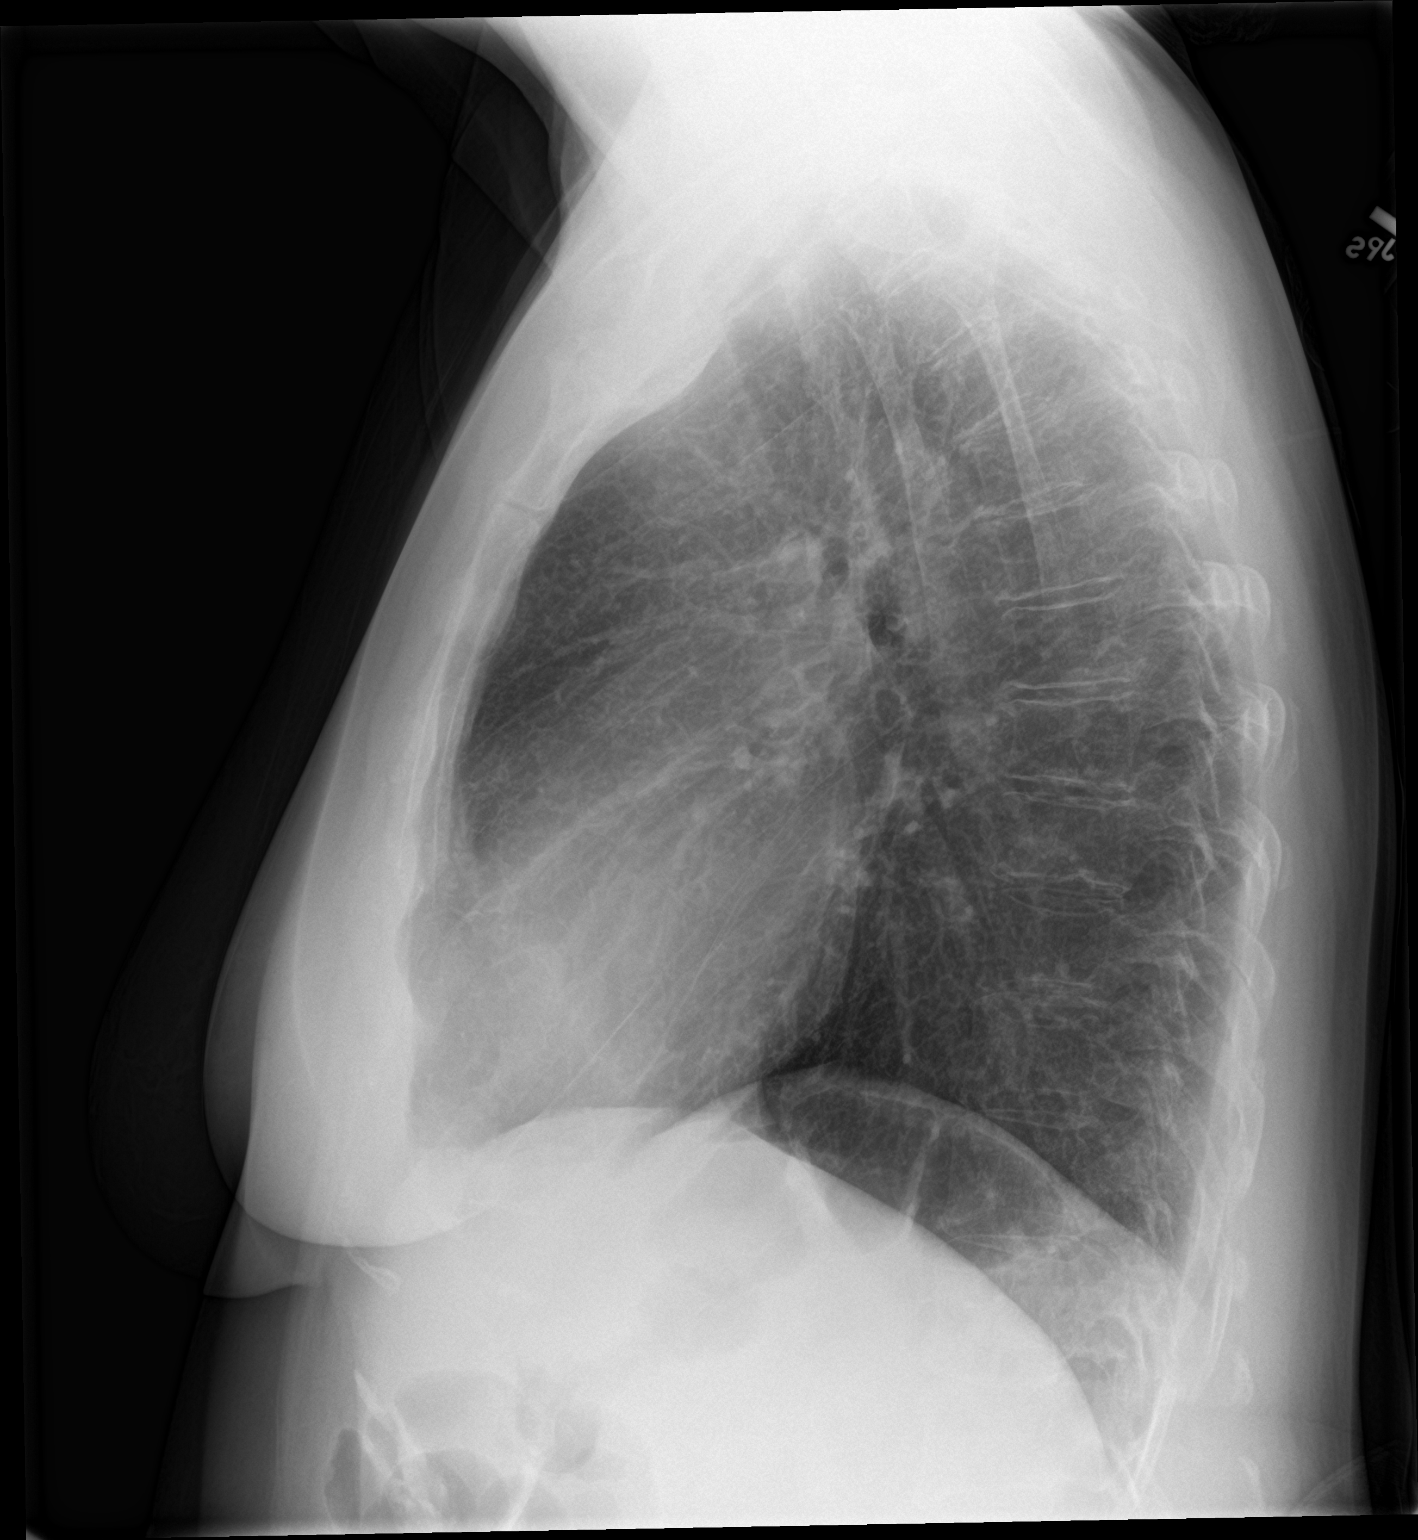

[2 of 2 positions shown; findings below may reference images not displayed]

FINDINGS: The chest is hyperexpanded with some coarsening of the pulmonary
interstitium. Lungs are clear. No pneumothorax or pleural effusion.
Heart size is normal. No acute bony abnormality.
IMPRESSION: No acute disease.

Findings compatible with COPD.
